# Patient Record
Sex: Male | Born: 1975 | Race: Black or African American | Hispanic: No | Marital: Married | Smoking: Never smoker
Health system: Southern US, Community
[De-identification: ages and names within clinical notes are randomized; demographics above are authoritative.]

## PROBLEM LIST (undated history)

## (undated) ENCOUNTER — Emergency Department (HOSPITAL_COMMUNITY): Admission: EM | Payer: Self-pay | Source: Home / Self Care

## (undated) DIAGNOSIS — E119 Type 2 diabetes mellitus without complications: Secondary | ICD-10-CM

## (undated) HISTORY — PX: FRACTURE SURGERY: SHX138

---

## 2006-07-26 ENCOUNTER — Emergency Department (HOSPITAL_COMMUNITY): Admission: EM | Admit: 2006-07-26 | Discharge: 2006-07-26 | Payer: Self-pay | Admitting: Family Medicine

## 2006-08-26 ENCOUNTER — Emergency Department (HOSPITAL_COMMUNITY): Admission: EM | Admit: 2006-08-26 | Discharge: 2006-08-26 | Payer: Self-pay | Admitting: Family Medicine

## 2008-06-08 ENCOUNTER — Inpatient Hospital Stay (HOSPITAL_COMMUNITY): Admission: EM | Admit: 2008-06-08 | Discharge: 2008-06-11 | Payer: Self-pay | Admitting: Emergency Medicine

## 2008-07-10 ENCOUNTER — Ambulatory Visit: Payer: Self-pay | Admitting: Family Medicine

## 2008-07-10 ENCOUNTER — Ambulatory Visit (HOSPITAL_COMMUNITY): Admission: RE | Admit: 2008-07-10 | Discharge: 2008-07-10 | Payer: Self-pay | Admitting: Family Medicine

## 2008-07-10 ENCOUNTER — Ambulatory Visit: Payer: Self-pay | Admitting: *Deleted

## 2008-07-10 LAB — CONVERTED CEMR LAB
ALT: 19 units/L (ref 0–53)
Alkaline Phosphatase: 59 units/L (ref 39–117)
BUN: 11 mg/dL (ref 6–23)
Basophils Absolute: 0 10*3/uL (ref 0.0–0.1)
Basophils Relative: 0 % (ref 0–1)
CO2: 23 meq/L (ref 19–32)
Cholesterol: 232 mg/dL — ABNORMAL HIGH (ref 0–200)
Creatinine, Ser: 0.95 mg/dL (ref 0.40–1.50)
Eosinophils Absolute: 0.1 10*3/uL (ref 0.0–0.7)
Eosinophils Relative: 2 % (ref 0–5)
Glucose, Bld: 210 mg/dL — ABNORMAL HIGH (ref 70–99)
HCT: 42.6 % (ref 39.0–52.0)
LDL Cholesterol: 172 mg/dL — ABNORMAL HIGH (ref 0–99)
MCV: 93.8 fL (ref 78.0–100.0)
Microalb, Ur: 6.69 mg/dL — ABNORMAL HIGH (ref 0.00–1.89)
RBC: 4.54 M/uL (ref 4.22–5.81)
Total CHOL/HDL Ratio: 5.7
Total Protein: 8.2 g/dL (ref 6.0–8.3)
VLDL: 19 mg/dL (ref 0–40)

## 2010-11-25 NOTE — Discharge Summary (Signed)
NAMECALAN, Philip Ingram                ACCOUNT NO.:  [id number]   MEDICAL RECORD NO.:  [id number]          PATIENT TYPE:  INP   LOCATION:  5158                         FACILITY:  [facility]   PHYSICIAN:  Isidor Holts, M.D.  DATE OF BIRTH:  Jun 29, 1976   DATE OF ADMISSION:  06/08/2008  DATE OF DISCHARGE:  06/11/2008                               DISCHARGE SUMMARY   PMD:  Unassigned.   DISCHARGE DIAGNOSES:  1. Right upper lobe community-acquired pneumonia.  2. Type 1 diabetes mellitus, new diagnosis.  3. Dehydration, secondary to #2 above.   DISCHARGE MEDICATIONS:  1. Tamiflu 75 mg p.o. b.i.d. on June 12, 2008.  2. Avelox 400 mg p.o. daily for 7 days, from June 12, 2008.  3. Lantus insulin 15 units subcutaneously q.12 hours.  4. NovoLog insulin subcutaneously per sliding scale as follows:  CBG      70-100 no insulin, CBG 101-150 2 units, CBG 151-200 3 units, CBG      201-250 5 units, CBG 251-300 7 units, CBG 301-350 9 units, CBG 351-      400 11 units.   PROCEDURES:  1. Chest x-ray dated June 08, 2008:  This showed probable      pneumonia in right upper lobe.  2. Chest x-ray dated June 09, 2008:  This showed further      consolidation right upper lobe.  3. Chest x-ray dated June 06, 2008:  This showed stable right      upper lobe pneumonia.   CONSULTATIONS:  None.   ADMISSION HISTORY:  As in H and P notes of June 08, 2008 dictated by  Dr. Lucita Ferrara.  However, in brief this is a 35 year old male, with no  significant past medical history, who presents with fever, shortness of  breath, increased urinary frequency, and thirst, associated with diffuse  myalgias, headache, and an unproductive cough.  He was on initial  evaluation, found to have leukocytosis of 20.  Chest x-ray showed right  upper lobe pneumonia.  Temperature was 102.6.  He was admitted for  further evaluation, investigation, and management.   CLINICAL COURSE:  1. Right upper lobe  community-acquired pneumonia.  For details of      presentation, refer to admission history above.  The patient was      managed with an initial combination of intravenous Vancomycin and      Zosyn. Blood cultures were sent off; however, remained negative.      Over the course of his hospitalization the patient steadily      improved.  As of June 01, 2008 he had no further recorded      episodes of pyrexia, and he experienced steady diminution in white      cell count which had dropped down from 20.0 at the time of      presentation, to 12.3 in the a.m. of June 11, 2008.  Because of      epidemiologic considerations the patient was placed on droplet      isolation during the course of this hospitalization, and treated      with a  5-day course of Tamiflu, to be completed on June 12, 2008.  As of June 06, 2008, he has improved so much, that he      was transitioned to monotherapy with Avelox for a further 7-day      course of treatment.   1. Type 1 diabetes mellitus.  This is a new diagnosis.  The patient      presented with polydipsia, polyuria, increased weakness as well as      dehydration, with a BUN of 17, creatinine 1.0, glucose 275.      Hemoglobin A1c was found to be significantly elevated at 12.1.  He      was managed with a combination of carbohydrate modified diet,      scheduled Lantus, and sliding scale insulin coverage, with      satisfactory clinical response.  By June 11, 2008 his CBGs were      ranging between 126 and 128, and he no longer had polydipsia or      polyuria.  He has undergone diabetic teaching during the course of      this hospitalization, and taught to self-administer his insulin, as      well as monitor his CBGs.  Arrangements have been put in place for      him to follow up with the outpatient diabetic clinic.   1. Dehydration.  The patient at time of presentation, appeared      clinically dehydrated with BUN 17, creatinine 1.0.   He had a      hypernatremia of 126, consistent with significant volume depletion.      He was managed with aggressive intravenous fluid hydration with      normal saline, and by June 10, 2008, hydration status had      significantly improved, the patient was euvolemic, BUN was 12, and      creatinine 0.81.  Intravenous fluids were discontinued, and on      June 11, 2008 BUN was 10, creatinine 0.72, and sodium 138.   DISPOSITION:  The patient was on June 11, 2008, considered  clinically stable for discharge.  He was therefore, discharged  accordingly.   DIET:  Carbohydrate modified.   ACTIVITY:  As tolerated.   FOLLOWUP INSTRUCTIONS:  The patient is recommended to follow up with  [facility] this week.  He has been supplied with the appropriate  information.   SPECIAL INSTRUCTIONS:  Outpatient diabetic clinic followup has been  arranged.      Isidor Holts, M.D.  Electronically Signed     CO/MEDQ  D:  06/11/2008  T:  06/11/2008  Job:  [id number]   cc:   Primary MD  Medical Department [facility]

## 2010-11-25 NOTE — H&P (Signed)
NAMEMAKANI, Philip                ACCOUNT NO.:  [id number]   MEDICAL RECORD NO.:  [id number]          PATIENT TYPE:  EMS   LOCATION:  [facility]JO                         FACILITY:  [facility]   PHYSICIAN:  Lucita Ferrara, MD         DATE OF BIRTH:  04-27-1976   DATE OF ADMISSION:  06/08/2008  DATE OF DISCHARGE:                              HISTORY & PHYSICAL   PRIMARY CARE DOCTOR:  Unassigned.   HISTORY:  The patient is a 35 year old African American male who  presents to [facility] with fevers, shortness of breath,  polyuria, polydipsia, polyphagia.  He also complains of myalgias,  diffuse headache and mild nonproductive cough.  He is weak.  Weakness is  generalized.  No focal neurological deficits.  He denies nausea,  vomiting, diarrhea.  He denies burning upon urination.  In the emergency  room, he was found to  leukocytosis of 20.  He was found to have a right  upper lobe pneumonia.  He is hemodynamically stable.   PAST MEDICAL HISTORY:  Ingram.   SOCIAL HISTORY:  The patient denies drugs, alcohol or tobacco.   ALLERGIES:  NO KNOWN DRUG ALLERGIES.   PAST SURGICAL HISTORY:  Ingram.   MEDICATIONS:  Ingram recently.   FAMILY HISTORY:  Otherwise negative.   PHYSICAL EXAMINATION:  GENERALLY SPEAKING:  The patient looks ill,  toxic.  The patient looks diaphoretic.  VITAL SIGNS:  Blood pressure is 142/94, pulse is 100-115, respiratory  rate 20, temperature T-max is 102.6, pulse ox is 99% on 2 liters nasal  cannula.  HEENT:  Mucous membranes are dry.  NECK:  Supple.  LUNGS:  Clear to auscultation bilaterally, no crackles, rales or  wheezes.  There is decreased air movement on the left side.  CARDIOVASCULAR:  S1 and S2, regular rate and rhythm.  No murmurs, rubs  or clicks.  ABDOMEN:  Soft, nontender, nondistended.  Positive bowel  sounds.  EXTREMITIES:  No clubbing, cyanosis or edema.  NEURO:  Patient is fatigued, but alert and oriented x3.  Cranial nerves  II-XII are grossly  intact.   LABORATORY DATA:  Glucose 275, hemoglobin 12.9, hematocrit 38, ionized  calcium 1.1, chloride 97, BUN 17, creatinine 1.0, white count 20,  hemoglobin 12.2, hematocrit 36.5, platelets 371.   DIAGNOSTICS:  Chest x-ray shows probable pneumonia in the right upper  lobe.   ASSESSMENT/PLAN:  A 35 year old with:  1. Leukocytosis, fever, diaphoresis and chest x-ray confirming a right      upper lobe pneumonia.  2. Hyperglycemia, rule out diabetes.  Question whether the patient has      new onset diabetes or if it is secondary to a reactive nature from      infectious etiology.   DISCUSSION/PLAN:  The patient will be admitted to the step-down unit.  We will initiate sepsis protocol,  panculture.  We will initiate IV  antibiotics broad-spectrum with vancomycin and Zosyn. Urinalysis and  urine drug screen.  Sliding-scale insulin, hemoglobin A1c, nebulizer  treatments, oxygen, ABG and hemodynamic monitoring.  I will also get a  lactic acid  level, CBC and consultation as needed if the patient's  status deteriorates.      Lucita Ferrara, MD  Electronically Signed     RR/MEDQ  D:  06/08/2008  T:  06/09/2008  Job:  [id number]

## 2011-04-14 LAB — CBC
HCT: 29.5 % — ABNORMAL LOW (ref 39.0–52.0)
HCT: 36.3 % — ABNORMAL LOW (ref 39.0–52.0)
Hemoglobin: 10.3 g/dL — ABNORMAL LOW (ref 13.0–17.0)
Hemoglobin: 10.4 g/dL — ABNORMAL LOW (ref 13.0–17.0)
Hemoglobin: 12.1 g/dL — ABNORMAL LOW (ref 13.0–17.0)
MCHC: 33.4 g/dL (ref 30.0–36.0)
MCHC: 34.4 g/dL (ref 30.0–36.0)
MCV: 90.6 fL (ref 78.0–100.0)
Platelets: 416 10*3/uL — ABNORMAL HIGH (ref 150–400)
RBC: 3.28 MIL/uL — ABNORMAL LOW (ref 4.22–5.81)
RBC: 3.35 MIL/uL — ABNORMAL LOW (ref 4.22–5.81)
RDW: 12.1 % (ref 11.5–15.5)
WBC: 12.3 10*3/uL — ABNORMAL HIGH (ref 4.0–10.5)
WBC: 14.9 10*3/uL — ABNORMAL HIGH (ref 4.0–10.5)

## 2011-04-14 LAB — GLUCOSE, CAPILLARY
Glucose-Capillary: 126 mg/dL — ABNORMAL HIGH (ref 70–99)
Glucose-Capillary: 128 mg/dL — ABNORMAL HIGH (ref 70–99)
Glucose-Capillary: 137 mg/dL — ABNORMAL HIGH (ref 70–99)
Glucose-Capillary: 145 mg/dL — ABNORMAL HIGH (ref 70–99)
Glucose-Capillary: 152 mg/dL — ABNORMAL HIGH (ref 70–99)
Glucose-Capillary: 153 mg/dL — ABNORMAL HIGH (ref 70–99)
Glucose-Capillary: 176 mg/dL — ABNORMAL HIGH (ref 70–99)
Glucose-Capillary: 188 mg/dL — ABNORMAL HIGH (ref 70–99)
Glucose-Capillary: 215 mg/dL — ABNORMAL HIGH (ref 70–99)
Glucose-Capillary: 273 mg/dL — ABNORMAL HIGH (ref 70–99)
Glucose-Capillary: 295 mg/dL — ABNORMAL HIGH (ref 70–99)

## 2011-04-14 LAB — URINALYSIS, ROUTINE W REFLEX MICROSCOPIC
Glucose, UA: >1000 mg/dL — AB
Ketones, ur: >80 mg/dL — AB
Nitrite: NEGATIVE
Protein, ur: 100 mg/dL — AB
Specific Gravity, Urine: 1.034 — ABNORMAL HIGH (ref 1.005–1.030)
pH: 5.5 (ref 5.0–8.0)

## 2011-04-14 LAB — COMPREHENSIVE METABOLIC PANEL
Albumin: 2.8 g/dL — ABNORMAL LOW (ref 3.5–5.2)
Alkaline Phosphatase: 75 U/L (ref 39–117)
BUN: 18 mg/dL (ref 6–23)
CO2: 11 mEq/L — ABNORMAL LOW (ref 19–32)
Chloride: 97 mEq/L (ref 96–112)
Chloride: 97 mEq/L (ref 96–112)
Creatinine, Ser: 1.23 mg/dL (ref 0.4–1.5)
Creatinine, Ser: 1.24 mg/dL (ref 0.4–1.5)
GFR calc Af Amer: >60 mL/min (ref >60)
GFR calc Af Amer: >60 mL/min (ref >60)
GFR calc non Af Amer: >60 mL/min (ref >60)
Potassium: 4.7 mEq/L (ref 3.5–5.1)
Sodium: 126 mEq/L — ABNORMAL LOW (ref 135–145)
Total Bilirubin: 2.3 mg/dL — ABNORMAL HIGH (ref 0.3–1.2)
Total Bilirubin: 2.6 mg/dL — ABNORMAL HIGH (ref 0.3–1.2)
Total Protein: 7.2 g/dL (ref 6.0–8.3)
Total Protein: 7.3 g/dL (ref 6.0–8.3)

## 2011-04-14 LAB — DIFFERENTIAL
Basophils Relative: 0 % (ref 0–1)
Eosinophils Absolute: 0 10*3/uL (ref 0.0–0.7)
Eosinophils Relative: 0 % (ref 0–5)
Eosinophils Relative: 0 % (ref 0–5)
Lymphocytes Relative: 9 % — ABNORMAL LOW (ref 12–46)
Lymphs Abs: 1.9 10*3/uL (ref 0.7–4.0)
Monocytes Relative: 10 % (ref 3–12)
Neutrophils Relative %: 80 % — ABNORMAL HIGH (ref 43–77)
Neutrophils Relative %: 83 % — ABNORMAL HIGH (ref 43–77)
Smear Review: ADEQUATE

## 2011-04-14 LAB — CULTURE, BLOOD (ROUTINE X 2)
Culture: NO GROWTH
Culture: NO GROWTH

## 2011-04-14 LAB — BASIC METABOLIC PANEL
BUN: 10 mg/dL (ref 6–23)
Calcium: 8 mg/dL — ABNORMAL LOW (ref 8.4–10.5)
Creatinine, Ser: 0.72 mg/dL (ref 0.4–1.5)
GFR calc Af Amer: >60 mL/min (ref >60)
GFR calc non Af Amer: >60 mL/min (ref >60)
GFR calc non Af Amer: >60 mL/min (ref >60)
Glucose, Bld: 155 mg/dL — ABNORMAL HIGH (ref 70–99)
Potassium: 3.7 mEq/L (ref 3.5–5.1)
Potassium: 3.7 mEq/L (ref 3.5–5.1)
Sodium: 132 mEq/L — ABNORMAL LOW (ref 135–145)

## 2011-04-14 LAB — URINE MICROSCOPIC-ADD ON

## 2011-04-14 LAB — LIPASE, BLOOD: Lipase: 30 U/L (ref 11–59)

## 2011-04-14 LAB — POCT I-STAT, CHEM 8
Hemoglobin: 12.9 g/dL — ABNORMAL LOW (ref 13.0–17.0)
Potassium: 4.5 mEq/L (ref 3.5–5.1)

## 2011-04-14 LAB — RAPID URINE DRUG SCREEN, HOSP PERFORMED
Amphetamines: NOT DETECTED
Benzodiazepines: NOT DETECTED

## 2011-04-14 LAB — URINE CULTURE

## 2011-04-14 LAB — HEMOGLOBIN A1C: Mean Plasma Glucose: 301 mg/dL

## 2014-03-31 ENCOUNTER — Emergency Department (HOSPITAL_COMMUNITY)
Admission: EM | Admit: 2014-03-31 | Discharge: 2014-03-31 | Disposition: A | Discharge status: Home / Self Care | Payer: Worker's Compensation | Attending: Emergency Medicine | Admitting: Emergency Medicine

## 2014-03-31 ENCOUNTER — Encounter (HOSPITAL_COMMUNITY): Payer: Self-pay | Admitting: Emergency Medicine

## 2014-03-31 ENCOUNTER — Emergency Department (HOSPITAL_COMMUNITY): Payer: Worker's Compensation

## 2014-03-31 DIAGNOSIS — S99929A Unspecified injury of unspecified foot, initial encounter: Secondary | ICD-10-CM

## 2014-03-31 DIAGNOSIS — E119 Type 2 diabetes mellitus without complications: Secondary | ICD-10-CM | POA: Insufficient documentation

## 2014-03-31 DIAGNOSIS — S8990XA Unspecified injury of unspecified lower leg, initial encounter: Secondary | ICD-10-CM | POA: Insufficient documentation

## 2014-03-31 DIAGNOSIS — S99919A Unspecified injury of unspecified ankle, initial encounter: Secondary | ICD-10-CM

## 2014-03-31 DIAGNOSIS — S82892A Other fracture of left lower leg, initial encounter for closed fracture: Secondary | ICD-10-CM

## 2014-03-31 DIAGNOSIS — Y9389 Activity, other specified: Secondary | ICD-10-CM | POA: Insufficient documentation

## 2014-03-31 DIAGNOSIS — S82853A Displaced trimalleolar fracture of unspecified lower leg, initial encounter for closed fracture: Secondary | ICD-10-CM | POA: Insufficient documentation

## 2014-03-31 DIAGNOSIS — Y9289 Other specified places as the place of occurrence of the external cause: Secondary | ICD-10-CM | POA: Insufficient documentation

## 2014-03-31 DIAGNOSIS — R296 Repeated falls: Secondary | ICD-10-CM | POA: Insufficient documentation

## 2014-03-31 MED ORDER — OXYCODONE-ACETAMINOPHEN 5-325 MG PO TABS
2.0000 | ORAL_TABLET | Freq: Once | ORAL | Status: AC
  Administered 2014-03-31: 2 via ORAL
  Filled 2014-03-31: qty 2

## 2014-03-31 MED ORDER — OXYCODONE-ACETAMINOPHEN 5-325 MG PO TABS: 1.0000 | ORAL_TABLET | Freq: Four times a day (QID) | ORAL | Status: AC | PRN

## 2014-03-31 NOTE — Discharge Instructions (Signed)
Keep weight off of left foot. Use crutches to help walk. Keep foot elevated above heart to help reduce swelling. Use ice, Percocet 1-2 pills every 4-6 hours as needed for pain. Followup with orthopedics on Monday morning.  Ankle Fracture A fracture is a break in a bone. The ankle joint is made up of three bones. These include the lower (distal)sections of your lower leg bones, called the tibia and fibula, along with a bone in your foot, called the talus. Depending on how bad the break is and if more than one ankle joint bone is broken, a cast or splint is used to protect and keep your injured bone from moving while it heals. Sometimes, surgery is required to help the fracture heal properly.  There are two general types of fractures:  Stable fracture. This includes a single fracture line through one bone, with no injury to ankle ligaments. A fracture of the talus that does not have any displacement (movement of the bone on either side of the fracture line) is also stable.  Unstable fracture. This includes more than one fracture line through one or more bones in the ankle joint. It also includes fractures that have displacement of the bone on either side of the fracture line. CAUSES  A direct blow to the ankle.   Quickly and severely twisting your ankle.  Trauma, such as a car accident or falling from a significant height. RISK FACTORS You may be at a higher risk of ankle fracture if:  You have certain medical conditions.  You are involved in high-impact sports.  You are involved in a high-impact car accident. SIGNS AND SYMPTOMS   Tender and swollen ankle.  Bruising around the injured ankle.  Pain on movement of the ankle.  Difficulty walking or putting weight on the ankle.  A cold foot below the site of the ankle injury. This can occur if the blood vessels passing through your injured ankle were also damaged.  Numbness in the foot below the site of the ankle injury. DIAGNOSIS    An ankle fracture is usually diagnosed with a physical exam and X-rays. A CT scan may also be required for complex fractures. TREATMENT  Stable fractures are treated with a cast or splint and using crutches to avoid putting weight on your injured ankle. This is followed by an ankle strengthening program. Some patients require a special type of cast, depending on other medical problems they may have. Unstable fractures require surgery to ensure the bones heal properly. Your health care provider will tell you what type of fracture you have and the best treatment for your condition. HOME CARE INSTRUCTIONS   Review correct crutch use with your health care provider and use your crutches as directed. Safe use of crutches is extremely important. Misuse of crutches can cause you to fall or cause injury to nerves in your hands or armpits.  Do not put weight or pressure on the injured ankle until directed by your health care provider.  To lessen the swelling, keep the injured leg elevated while sitting or lying down.  Apply ice to the injured area:  Put ice in a plastic bag.  Place a towel between your cast and the bag.  Leave the ice on for 20 minutes, 2-3 times a day.  If you have a plaster or fiberglass cast:  Do not try to scratch the skin under the cast with any objects. This can increase your risk of skin infection.  Check the skin around  the cast every day. You may put lotion on any red or sore areas.  Keep your cast dry and clean.  If you have a plaster splint:  Wear the splint as directed.  You may loosen the elastic around the splint if your toes become numb, tingle, or turn cold or blue.  Do not put pressure on any part of your cast or splint; it may break. Rest your cast only on a pillow the first 24 hours until it is fully hardened.  Your cast or splint can be protected during bathing with a plastic bag sealed to your skin with medical tape. Do not lower the cast or splint  into water.  Take medicines as directed by your health care provider. Only take over-the-counter or prescription medicines for pain, discomfort, or fever as directed by your health care provider.  Do not drive a vehicle until your health care provider specifically tells you it is safe to do so.  If your health care provider has given you a follow-up appointment, it is very important to keep that appointment. Not keeping the appointment could result in a chronic or permanent injury, pain, and disability. If you have any problem keeping the appointment, call the facility for assistance. SEEK MEDICAL CARE IF: You develop increased swelling or discomfort. SEEK IMMEDIATE MEDICAL CARE IF:   Your cast gets damaged or breaks.  You have continued severe pain.  You develop new pain or swelling after the cast was put on.  Your skin or toenails below the injury turn blue or gray.  Your skin or toenails below the injury feel cold, numb, or have loss of sensitivity to touch.  There is a bad smell or pus draining from under the cast. MAKE SURE YOU:   Understand these instructions.  Will watch your condition.  Will get help right away if you are not doing well or get worse. Document Released: 06/26/2000 Document Revised: 07/04/2013 Document Reviewed: 01/26/2013 [facility] Patient Information 2015 [place], [place]. This information is not intended to replace advice given to you by your health care provider. Make sure you discuss any questions you have with your health care provider.   Emergency Department Resource Guide 1) Find a Doctor and Pay Out of Pocket Although you won't have to find out who is covered by your insurance plan, it is a good idea to ask around and get recommendations. You will then need to call the office and see if the doctor you have chosen will accept you as a new patient and what types of options they offer for patients who are self-pay. Some doctors offer discounts or will set  up payment plans for their patients who do not have insurance, but you will need to ask so you aren't surprised when you get to your appointment.  2) Contact Your Local Health Department Not all health departments have doctors that can see patients for sick visits, but many do, so it is worth a call to see if yours does. If you don't know where your local health department is, you can check in your phone book. The CDC also has a tool to help you locate your state's health department, and many state websites also have listings of all of their local health departments.  3) Find a Walk-in Clinic If your illness is not likely to be very severe or complicated, you may want to try a walk in clinic. These are popping up all over the country in pharmacies, drugstores, and shopping centers.  They're usually staffed by nurse practitioners or physician assistants that have been trained to treat common illnesses and complaints. They're usually fairly quick and inexpensive. However, if you have serious medical issues or chronic medical problems, these are probably not your best option.  No Primary Care Doctor: - Call Health Connect at  [phone] - they can help you locate a primary care doctor that  accepts your insurance, provides certain services, etc. - Physician Referral Service- [phone]  Chronic Pain Problems: Organization         Address  Phone   Notes  [facility]  [phone] Patients need to be referred by their primary care doctor.   Medication Assistance: Organization         Address  Phone   Notes  [facility] Medication [facility] [address] [place] [place], [place] [address] [phone] --Must be a resident of [facility] -- Must have NO insurance coverage whatsoever (no Medicaid/ Medicare, etc.) -- The pt. MUST have a primary care doctor that directs their care regularly and follows them in the community   [facility]  [phone]    [employer]  [phone]    Agencies that provide inexpensive medical care: Organization         Address  Phone   Notes  [facility]  [phone]   [facility]    [phone]   [facility] [address] [place], [place] [address] [phone]   [facility] [address] [place] [place] [phone]   Planned Parenthood    [phone]   [facility]    [phone]   [facility]  [address], [place] Phone:  [phone], Fax:  [phone] Hours of Operation:  9 am - 6 pm, M-F.  Also accepts Medicaid/Medicare and self-pay.  [facility]  [address], [place] Phone: [phone], Fax: [phone]. Hours of Operation:  8:30 am - 5:30 pm, M-F.  Also accepts Medicaid and self-pay.  [facility] [place] [address] [place] Phone: [phone]   [facility] [address] Natasha Bence [place], [place] [phone], Ext. [phone] Mondays & Thursdays: 7-9 AM.  First 15 patients are seen on a first come, first serve basis.    Medicaid-accepting [facility] Providers:  Organization         Address  Phone   Notes  [facility] [address], [place] [phone] Also accepts self-pay patients.  [facility] [address] Laurell Josephs [place], [place]  [phone]   [facility] [address], [place] [phone]   [facility] [address] [place] [phone]   Renaye Rakers [address], [place]   [phone] Only accepts [place] patients after they have their name applied to their card.   Self-Pay (no insurance) in [facility]:  Organization         Address  Phone   Notes  Sickle Cell Patients, [facility] Internal Medicine [address] [place],  [place] [phone]   [facility] [address] [place], [place] [phone]   [facility] [place]  [address],  [phone]  [facility]/Dr. Osei-Bonsu  [address], [place] or [address], [place] [phone] Phone number for both [place] and [place] locations is the same.  [facility] [address], [place] [phone]   [facility] [address] [place] or [address] [phone]   [facility] [address] [place] [phone], phone; [phone], fax Sees patients 1st and 3rd Saturday of every month.  Must not qualify for public or private insurance (i.e. Medicaid, Medicare, [place] Health Choice, Veterans' Benefits)  Household income should be no more than 200% of the poverty level The clinic cannot treat you if you are pregnant or think you are pregnant  Sexually transmitted diseases are not treated at the clinic.    Dental Care: Organization         Address  Phone  Notes  [facility] Department of [facility] [address] [place], [place] [phone] Accepts children up to age 50 who are enrolled in [place] or [place] Health Choice; pregnant women with a Medicaid card; and children who have applied for Medicaid or [place] Health Choice, but were declined, whose parents can pay a reduced fee at time of service.  [facility] Department of [facility]  [address], [place] [phone] Accepts children up to age 56 who are enrolled in [place] or [place] Health Choice; pregnant women with a Medicaid card; and children who have applied for Medicaid or  Health Choice, but were declined, whose parents can pay a reduced fee at time of service.  [facility]  [address] [place], [place] [phone] Patients are seen by appointment only. Walk-ins are not accepted. [facility] will see patients 45 years of age and older. Monday - Tuesday (8am-5pm) Most Wednesdays (8:30-5pm) $30 per visit, cash only  [facility]  [address], [facility] [phone] Patients are seen by appointment only. Walk-ins are not accepted. [facility] will see patients 43 years of age and older. One Wednesday Evening (Monthly: Volunteer Based).  $30 per visit, cash only  [employer]  [phone] for adults; Children under age 56, call Graduate Pediatric Dentistry at [phone]. Children aged 35-14, please call [phone] to request a pediatric application.  Dental services are provided in all areas of dental care including fillings, crowns and bridges, complete and partial dentures, implants, gum treatment, root canals, and extractions. Preventive care is also provided. Treatment is provided to both adults and children. Patients are selected via a lottery and there is often a waiting list.   [facility] [address] [place]  [phone] [email or web address]   [facility] [address] [place], [place] [phone], Ext. [phone] Second and Fourth Thursday of each month, opens at 6:30 AM; Clinic ends at 9 AM.  Patients are seen on a first-come first-served basis, and a limited number are seen during each clinic.   [facility]  [address] Ether Griffins [place], [place] [phone]   Eligibility Requirements You must have lived in [place], [place], or [place] counties for at least the last three months.   You cannot be eligible for state or federal sponsored [place], including [employer], [place], or [employer].   You generally cannot be eligible for healthcare insurance through  your employer.    How to apply: Eligibility screenings are held every Tuesday and Wednesday afternoon  from 1:00 pm until 4:00 pm. You do not need an appointment for the interview!  [facility] [address], [place], [place] [phone]   [facility] Health Department  [phone]   [facility] Health Department  [phone]   [facility] Health Department  [phone]    Behavioral Health Resources in the Community: Intensive Outpatient Programs Organization         Address  Phone  Notes  [facility] [address], [place], [place] [phone]   [facility] [address] [place], [place] [phone]   [facility] [address] [place], [place]  [phone]   [facility] [address]  [place], [place] [phone] or [phone]   Substance Abuse Resources Organization         Address  Phone  Notes  [facility]  [phone]   [facility]  [phone]   [facility] [place]  [phone]   Floydene Flock  [phone]   Residential & Outpatient Substance Abuse Program  [phone]   Psychological Services Organization         Address  Phone  Notes  [facility]  [phone]   [facility] Services  [phone]   [facility] Mental Health [address] [place] [phone] or [phone]    Mobile Crisis Teams Organization         Address  Phone  Notes  [facility], Mobile Crisis Care Unit  [phone]   [facility]  [address] [place], [place] [phone]   Doristine Locks [address] [place] [phone]    Self-Help/Support Groups Organization         Address  Phone             Notes  [facility] [place] - variety of support groups  [phone]- I7437963 Call for more information  Narcotics Anonymous (NA), [facility] [address], [employer] [place]  2 meetings at this location   Nutritional therapist         Address  Phone  Notes  [facility] [address],    [place]  [phone]   [facility]  [address] [place] [address], [place], [place] [phone]   [facility] [address] [place], [place] [phone] Admissions: 8am-3pm M-F  [facility] [address]    [place], [place] [phone]   [facility] [address] [place], [place], [place] [phone]   [facility] [address],  [place], [place] [phone]   [facility] - Intensive Outpatient [address] Laurell Josephs [address], [place], [place] [phone]   [facility] ([facility] [address]  [place], [place] [phone] or [phone]   [facility] ([facility]) [address], [place], [place] [phone] Accepts Medicaid  [facility] [address],  [place] [phone] Substance Abuse/Addiction Treatment   [facility] Organization         Address  Phone  Notes  [facility]  [phone]   Angie Fava, PhD [address] [place], [place]   [phone] or [phone]   [facility] Behavioral   [address] [place], [place] [phone]  [facility] [address] [place], [place] [phone] Insurance/Medicaid/sponsorship through [employer] and Families [address] [place], [place] [phone] [place], [place] [phone]    Dr. Adele Schilder  [phone]   [facility] Dept. 1) [address], [place] 2) [place] 3)  [place] [address], [place] [phone]  [phone]   [place] [phone] or [phone] (After Hours)

## 2014-03-31 NOTE — ED Notes (Signed)
Pt slipped on a curb injuring left ankle- swelling and redness noted to ankle- pedal pulse intact.

## 2014-03-31 NOTE — ED Provider Notes (Signed)
CSN: [id number]     Arrival date & time 03/31/14  1637 History  This chart was scribed for non-physician practitioner working with No att. providers found by Elveria Rising, ED Scribe. This patient was seen in room TR09C/TR09C and the patient's care was started at 5:38 PM.   Chief Complaint  Patient presents with  . Ankle Pain   The history is provided by the patient. No language interpreter was used.   HPI Comments: Philip Ingram is a 38 y.o. male who presents to the Emergency Department with a left ankle injury incurred today. 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 Patient presents  With severe swelling to his left medial and lateral malleolus. Patient reports everting his ankle during the fall and reports obvious deformity after the fall. Patient thought he had "dislocated his ankle" but it appears that he reduced the dislocation with standing. Patient reports that he was ambulatory immediately after the incident with pain and he was able to bear light weight. Since the fall patient reports increased swelling and pain.  Past Medical History  Diagnosis Date  . Diabetes mellitus without complication    History reviewed. No pertinent past surgical history. No family history on file. History  Substance Use Topics  . Smoking status: Never Smoker   . Smokeless tobacco: Not on file  . Alcohol Use: No    Review of Systems  Constitutional: Negative for fever and chills.  Gastrointestinal: Negative for nausea, vomiting and diarrhea.  Musculoskeletal: Positive for arthralgias. Negative for back pain and gait problem.  Neurological: Negative for weakness and numbness.   Allergies  Review of patient's allergies indicates no known allergies.  Home Medications   Prior to Admission medications   Medication Sig Start Date End Date Taking? Authorizing Provider  oxyCODONE-acetaminophen (PERCOCET) 5-325 MG per  tablet Take 1-2 tablets by mouth every 6 (six) hours as needed. 03/31/14   Monte Fantasia, PA-C   Triage Vitals: BP 120/84  Pulse 95  Temp(Src) 98.4 F (36.9 C) (Oral)  Resp 18  Ht  (1.727 m)  Wt 200 lb (90.719 kg)  BMI 30.42 kg/m2  SpO2 99%  Physical Exam  Nursing note and vitals reviewed. Constitutional: He is oriented to person, place, and time. He appears well-developed and well-nourished. No distress.  HENT:  Head: Normocephalic and atraumatic.  Eyes: EOM are normal.  Neck: Neck supple.  Cardiovascular: Normal rate.   Pulmonary/Chest: Effort normal. No respiratory distress.  Musculoskeletal: Normal range of motion. He exhibits tenderness.  Moderate swelling to lateral and medial malleolus. Good plantar and dorsal flexion. Pain with nversion and eversion to his foot. DP 2+. PT unable to tell due to swelling. Distal sensation intact. Capillary refill <2 seconds.   Neurological: He is alert and oriented to person, place, and time.  Skin: Skin is warm and dry.  Psychiatric: He has a normal mood and affect. His behavior is normal.    ED Course  Procedures (including critical care time)  COORDINATION OF CARE: 5:38 PM- Plans to X-ray. Patient advised to follow up with ortho. Discussed treatment plan with patient at bedside and patient agreed to plan.   Labs Review Labs Reviewed - No data to display  Imaging Review Dg Ankle Complete Left  03/31/2014   CLINICAL DATA:  Inversion injury of the left ankle now with diffuse swelling and pain  EXAM: LEFT ANKLE COMPLETE - 3+ VIEW  COMPARISON:  None.  FINDINGS: The patient has sustained a trimalleolar fracture of the left ankle. The spiral fracture of the distal fibula is mildly distracted and angulated. The medial malleolar fracture is transversely oriented and minimally distracted. The small posterior malleolar fracture is mildly distracted. The ankle joint mortise is reasonably well maintained. The talus and calcaneus are intact.  The metatarsal bases are intact. There is diffuse soft tissue swelling.  IMPRESSION: There is a mildly displaced trimalleolar fracture of the left ankle.   Electronically Signed   By: David  [place]   On: 03/31/2014 18:10     EKG Interpretation None      MDM   Final diagnoses:  Malleolar fracture, left, closed, initial encounter   Patient here with left ankle pain x-ray showing a trimalleolar fracture. We will treat pain, call orthopedics  7:00 PM: I spoke with Dr. Lajoyce Corners with orthopedic surgery who recommends placing patient in a Cam Boot walker, using crutches, and keeping his foot elevated above his heart weekend. Nonweightbearing, and to call orthopedics Monday morning after 7:15 AM.  Patient placed in boot, given crutches, and pain medicine to go home with. Patient agreed to the above plan. Patient stating he is not currently taking anything for diabetes, not has not had any followup for in a few years. Patient states he was formally on metformin, and after losing weight, he stopped taking metformin, and has not had any followup since. I encouraged patient also call his primary care physician on Monday, to help insure his diabetes is under control if he is to have surgery. Patient is agreeable to this plan. I also encouraged patient to use RICE protocol and return to the ER if his symptoms worsen, change or should he have any questions or concerns.  Signed,  Ladona Mow, PA-C 12:17 AM   This patient discussed with Dr. Blake Divine, MD  I personally performed the services described in this documentation, which was scribed in my presence. The recorded information has been reviewed and is accurate.    Monte Fantasia, PA-C 04/01/14 [phone]

## 2014-04-01 NOTE — ED Provider Notes (Signed)
Medical screening examination/treatment/procedure(s) were performed by non-physician practitioner and as supervising physician I was immediately available for consultation/collaboration.   EKG Interpretation None        Candyce Churn III, MD 04/01/14 [phone]

## 2014-04-02 ENCOUNTER — Telehealth: Payer: Self-pay | Admitting: *Deleted

## 2014-04-02 NOTE — Telephone Encounter (Signed)
Philip Ingram from [facility] office called to request NPI number for patient.  Pt has appt for left ankle fracture.  NPI given x 1 visit, pt needs to contact medicaid office to change provider information on card. Clovis Pu, RN

## 2014-04-05 ENCOUNTER — Other Ambulatory Visit (HOSPITAL_COMMUNITY): Payer: Self-pay | Admitting: Orthopedic Surgery

## 2014-04-05 ENCOUNTER — Encounter (HOSPITAL_COMMUNITY): Payer: Self-pay | Admitting: Pharmacy Technician

## 2014-04-05 NOTE — Progress Notes (Signed)
Several unsuccessful attempts were made to contact pt; lvm at # [phone] with pre-op instructions.

## 2014-04-06 ENCOUNTER — Encounter (HOSPITAL_COMMUNITY): Payer: Self-pay | Admitting: *Deleted

## 2014-04-06 ENCOUNTER — Ambulatory Visit (HOSPITAL_COMMUNITY): Payer: Worker's Compensation

## 2014-04-06 ENCOUNTER — Ambulatory Visit (HOSPITAL_COMMUNITY): Payer: Worker's Compensation | Admitting: Anesthesiology

## 2014-04-06 ENCOUNTER — Ambulatory Visit (HOSPITAL_COMMUNITY)
Admission: RE | Admit: 2014-04-06 | Discharge: 2014-04-07 | Disposition: A | Discharge status: Home / Self Care | Payer: Worker's Compensation | Source: Ambulatory Visit | Attending: Orthopedic Surgery | Admitting: Orthopedic Surgery

## 2014-04-06 ENCOUNTER — Encounter (HOSPITAL_COMMUNITY)
Admission: RE | Disposition: A | Discharge status: Home / Self Care | Payer: Self-pay | Source: Ambulatory Visit | Attending: Orthopedic Surgery

## 2014-04-06 ENCOUNTER — Encounter (HOSPITAL_COMMUNITY): Payer: Worker's Compensation | Admitting: Anesthesiology

## 2014-04-06 DIAGNOSIS — S82842A Displaced bimalleolar fracture of left lower leg, initial encounter for closed fracture: Secondary | ICD-10-CM

## 2014-04-06 DIAGNOSIS — S82843A Displaced bimalleolar fracture of unspecified lower leg, initial encounter for closed fracture: Secondary | ICD-10-CM | POA: Diagnosis present

## 2014-04-06 DIAGNOSIS — X58XXXA Exposure to other specified factors, initial encounter: Secondary | ICD-10-CM | POA: Insufficient documentation

## 2014-04-06 DIAGNOSIS — E119 Type 2 diabetes mellitus without complications: Secondary | ICD-10-CM | POA: Insufficient documentation

## 2014-04-06 HISTORY — PX: ORIF ANKLE FRACTURE BIMALLEOLAR: SUR920

## 2014-04-06 HISTORY — PX: ORIF ANKLE FRACTURE: SHX5408

## 2014-04-06 HISTORY — DX: Type 2 diabetes mellitus without complications: E11.9

## 2014-04-06 LAB — COMPREHENSIVE METABOLIC PANEL
ALK PHOS: 74 U/L (ref 39–117)
ALT: 14 U/L (ref 0–53)
AST: 11 U/L (ref 0–37)
Albumin: 3.8 g/dL (ref 3.5–5.2)
Anion gap: 17 — ABNORMAL HIGH (ref 5–15)
BUN: 12 mg/dL (ref 6–23)
CO2: 24 meq/L (ref 19–32)
Calcium: 9.5 mg/dL (ref 8.4–10.5)
Chloride: 99 mEq/L (ref 96–112)
Creatinine, Ser: 0.76 mg/dL (ref 0.50–1.35)
GFR calc non Af Amer: >90 mL/min (ref >90)
GLUCOSE: 320 mg/dL — AB (ref 70–99)
POTASSIUM: 4.3 meq/L (ref 3.7–5.3)
SODIUM: 140 meq/L (ref 137–147)
TOTAL PROTEIN: 7.9 g/dL (ref 6.0–8.3)
Total Bilirubin: 1 mg/dL (ref 0.3–1.2)

## 2014-04-06 LAB — GLUCOSE, CAPILLARY
GLUCOSE-CAPILLARY: 172 mg/dL — AB (ref 70–99)
Glucose-Capillary: 324 mg/dL — ABNORMAL HIGH (ref 70–99)
Glucose-Capillary: 274 mg/dL — ABNORMAL HIGH (ref 70–99)
Glucose-Capillary: 243 mg/dL — ABNORMAL HIGH (ref 70–99)
Glucose-Capillary: 243 mg/dL — ABNORMAL HIGH (ref 70–99)
Glucose-Capillary: 205 mg/dL — ABNORMAL HIGH (ref 70–99)

## 2014-04-06 LAB — CBC
HCT: 37.8 % — ABNORMAL LOW (ref 39.0–52.0)
HEMOGLOBIN: 13.3 g/dL (ref 13.0–17.0)
MCH: 30.8 pg (ref 26.0–34.0)
MCHC: 35.2 g/dL (ref 30.0–36.0)
MCV: 87.5 fL (ref 78.0–100.0)
Platelets: 374 10*3/uL (ref 150–400)
RBC: 4.32 MIL/uL (ref 4.22–5.81)
RDW: 12.1 % (ref 11.5–15.5)
WBC: 7.8 10*3/uL (ref 4.0–10.5)

## 2014-04-06 LAB — PROTIME-INR
INR: 1.02 (ref 0.00–1.49)
Prothrombin Time: 13.4 seconds (ref 11.6–15.2)

## 2014-04-06 LAB — APTT: APTT: 25 s (ref 24–37)

## 2014-04-06 SURGERY — OPEN REDUCTION INTERNAL FIXATION (ORIF) ANKLE FRACTURE
Anesthesia: General | Site: Ankle | Laterality: Left

## 2014-04-06 MED ORDER — PROPOFOL 10 MG/ML IV BOLUS
INTRAVENOUS | Status: AC
  Filled 2014-04-06: qty 20

## 2014-04-06 MED ORDER — CEFAZOLIN SODIUM-DEXTROSE 2-3 GM-% IV SOLR
2.0000 g | INTRAVENOUS | Status: AC
  Administered 2014-04-06: 2 g via INTRAVENOUS

## 2014-04-06 MED ORDER — OXYCODONE-ACETAMINOPHEN 5-325 MG PO TABS: 1.0000 | ORAL_TABLET | ORAL | Status: AC | PRN

## 2014-04-06 MED ORDER — METHOCARBAMOL 1000 MG/10ML IJ SOLN
500.0000 mg | Freq: Four times a day (QID) | INTRAVENOUS | Status: DC | PRN
  Filled 2014-04-06: qty 5

## 2014-04-06 MED ORDER — FENTANYL CITRATE 0.05 MG/ML IJ SOLN
INTRAMUSCULAR | Status: DC | PRN
  Administered 2014-04-06: 150 ug via INTRAVENOUS
  Administered 2014-04-06: 100 ug via INTRAVENOUS

## 2014-04-06 MED ORDER — MIDAZOLAM HCL 2 MG/2ML IJ SOLN
INTRAMUSCULAR | Status: DC | PRN
  Administered 2014-04-06: 2 mg via INTRAVENOUS

## 2014-04-06 MED ORDER — INSULIN ASPART 100 UNIT/ML ~~LOC~~ SOLN
10.0000 [IU] | Freq: Once | SUBCUTANEOUS | Status: AC
  Administered 2014-04-06: 10 [IU] via SUBCUTANEOUS

## 2014-04-06 MED ORDER — MIDAZOLAM HCL 2 MG/2ML IJ SOLN
INTRAMUSCULAR | Status: AC
  Filled 2014-04-06: qty 2

## 2014-04-06 MED ORDER — ONDANSETRON HCL 4 MG/2ML IJ SOLN
INTRAMUSCULAR | Status: DC | PRN
  Administered 2014-04-06: 4 mg via INTRAVENOUS

## 2014-04-06 MED ORDER — ONDANSETRON HCL 4 MG/2ML IJ SOLN
4.0000 mg | Freq: Four times a day (QID) | INTRAMUSCULAR | Status: DC | PRN
Start: 2014-04-06 — End: 2014-04-07

## 2014-04-06 MED ORDER — ONDANSETRON HCL 4 MG PO TABS: 4.0000 mg | ORAL_TABLET | Freq: Four times a day (QID) | ORAL | Status: DC | PRN

## 2014-04-06 MED ORDER — PROPOFOL 10 MG/ML IV BOLUS
INTRAVENOUS | Status: DC | PRN
  Administered 2014-04-06: 200 mg via INTRAVENOUS

## 2014-04-06 MED ORDER — ONDANSETRON HCL 4 MG/2ML IJ SOLN: 4.0000 mg | Freq: Once | INTRAMUSCULAR | Status: DC | PRN

## 2014-04-06 MED ORDER — HYDROMORPHONE HCL 1 MG/ML IJ SOLN: 0.5000 mg | INTRAMUSCULAR | Status: DC | PRN

## 2014-04-06 MED ORDER — ONDANSETRON HCL 4 MG/2ML IJ SOLN
INTRAMUSCULAR | Status: AC
  Filled 2014-04-06: qty 2

## 2014-04-06 MED ORDER — METOCLOPRAMIDE HCL 5 MG/ML IJ SOLN: 5.0000 mg | Freq: Three times a day (TID) | INTRAMUSCULAR | Status: DC | PRN

## 2014-04-06 MED ORDER — CEFAZOLIN SODIUM 1-5 GM-% IV SOLN
1.0000 g | Freq: Four times a day (QID) | INTRAVENOUS | Status: AC
  Administered 2014-04-06 – 2014-04-07 (×3): 1 g via INTRAVENOUS
  Filled 2014-04-06 (×3): qty 50

## 2014-04-06 MED ORDER — INSULIN ASPART 100 UNIT/ML ~~LOC~~ SOLN
5.0000 [IU] | Freq: Once | SUBCUTANEOUS | Status: AC
  Administered 2014-04-06: 5 [IU] via INTRAVENOUS
  Filled 2014-04-06: qty 1
  Filled 2014-04-06: qty 0.05

## 2014-04-06 MED ORDER — OXYCODONE-ACETAMINOPHEN 5-325 MG PO TABS
1.0000 | ORAL_TABLET | ORAL | Status: DC | PRN
  Administered 2014-04-06 – 2014-04-07 (×6): 2 via ORAL
  Filled 2014-04-06 (×8): qty 2

## 2014-04-06 MED ORDER — NEOSTIGMINE METHYLSULFATE 10 MG/10ML IV SOLN
INTRAVENOUS | Status: DC | PRN
  Administered 2014-04-06: 2 mg via INTRAVENOUS

## 2014-04-06 MED ORDER — LACTATED RINGERS IV SOLN
INTRAVENOUS | Status: DC
Start: 2014-04-06 — End: 2014-04-06
  Administered 2014-04-06 (×3): via INTRAVENOUS

## 2014-04-06 MED ORDER — DOCUSATE SODIUM 100 MG PO CAPS
100.0000 mg | ORAL_CAPSULE | Freq: Two times a day (BID) | ORAL | Status: DC
  Administered 2014-04-06 – 2014-04-07 (×2): 100 mg via ORAL
  Filled 2014-04-06 (×2): qty 1

## 2014-04-06 MED ORDER — INSULIN ASPART 100 UNIT/ML ~~LOC~~ SOLN
0.0000 [IU] | Freq: Every day | SUBCUTANEOUS | Status: DC
Start: 2014-04-06 — End: 2014-04-07

## 2014-04-06 MED ORDER — INSULIN ASPART 100 UNIT/ML ~~LOC~~ SOLN
SUBCUTANEOUS | Status: AC
  Filled 2014-04-06: qty 10

## 2014-04-06 MED ORDER — METHOCARBAMOL 500 MG PO TABS
500.0000 mg | ORAL_TABLET | Freq: Four times a day (QID) | ORAL | Status: DC | PRN
  Administered 2014-04-06 – 2014-04-07 (×2): 500 mg via ORAL
  Filled 2014-04-06 (×3): qty 1

## 2014-04-06 MED ORDER — LIDOCAINE HCL (CARDIAC) 20 MG/ML IV SOLN
INTRAVENOUS | Status: AC
  Filled 2014-04-06: qty 5

## 2014-04-06 MED ORDER — LIDOCAINE HCL (CARDIAC) 20 MG/ML IV SOLN
INTRAVENOUS | Status: DC | PRN
  Administered 2014-04-06: 80 mg via INTRAVENOUS

## 2014-04-06 MED ORDER — CEFAZOLIN SODIUM-DEXTROSE 2-3 GM-% IV SOLR
INTRAVENOUS | Status: AC
  Filled 2014-04-06: qty 50

## 2014-04-06 MED ORDER — ASPIRIN EC 325 MG PO TBEC
325.0000 mg | DELAYED_RELEASE_TABLET | Freq: Every day | ORAL | Status: DC
  Administered 2014-04-06 – 2014-04-07 (×2): 325 mg via ORAL
  Filled 2014-04-06 (×2): qty 1

## 2014-04-06 MED ORDER — SODIUM CHLORIDE 0.9 % IV SOLN
INTRAVENOUS | Status: DC
  Administered 2014-04-06: 19:00:00 via INTRAVENOUS

## 2014-04-06 MED ORDER — HYDROMORPHONE HCL 1 MG/ML IJ SOLN
INTRAMUSCULAR | Status: DC | PRN
  Administered 2014-04-06: 1 mg via INTRAVENOUS

## 2014-04-06 MED ORDER — GLYCOPYRROLATE 0.2 MG/ML IJ SOLN
INTRAMUSCULAR | Status: AC
  Filled 2014-04-06: qty 4

## 2014-04-06 MED ORDER — HYDROMORPHONE HCL 1 MG/ML IJ SOLN
INTRAMUSCULAR | Status: AC
  Filled 2014-04-06: qty 1

## 2014-04-06 MED ORDER — ROCURONIUM BROMIDE 50 MG/5ML IV SOLN
INTRAVENOUS | Status: AC
  Filled 2014-04-06: qty 1

## 2014-04-06 MED ORDER — INSULIN ASPART 100 UNIT/ML ~~LOC~~ SOLN
5.0000 [IU] | Freq: Once | SUBCUTANEOUS | Status: AC
  Administered 2014-04-06: 5 [IU] via INTRAVENOUS
  Filled 2014-04-06: qty 0.05
  Filled 2014-04-06: qty 1

## 2014-04-06 MED ORDER — INSULIN ASPART 100 UNIT/ML ~~LOC~~ SOLN
0.0000 [IU] | Freq: Three times a day (TID) | SUBCUTANEOUS | Status: DC
Start: 2014-04-07 — End: 2014-04-07
  Administered 2014-04-07: 3 [IU] via SUBCUTANEOUS
  Administered 2014-04-07: 8 [IU] via SUBCUTANEOUS
  Administered 2014-04-07: 5 [IU] via SUBCUTANEOUS

## 2014-04-06 MED ORDER — 0.9 % SODIUM CHLORIDE (POUR BTL) OPTIME
TOPICAL | Status: DC | PRN
  Administered 2014-04-06: 1000 mL

## 2014-04-06 MED ORDER — NEOSTIGMINE METHYLSULFATE 10 MG/10ML IV SOLN
INTRAVENOUS | Status: AC
  Filled 2014-04-06: qty 1

## 2014-04-06 MED ORDER — ROCURONIUM BROMIDE 100 MG/10ML IV SOLN
INTRAVENOUS | Status: DC | PRN
  Administered 2014-04-06: 20 mg via INTRAVENOUS

## 2014-04-06 MED ORDER — METOCLOPRAMIDE HCL 10 MG PO TABS: 5.0000 mg | ORAL_TABLET | Freq: Three times a day (TID) | ORAL | Status: DC | PRN

## 2014-04-06 MED ORDER — GLYCOPYRROLATE 0.2 MG/ML IJ SOLN
INTRAMUSCULAR | Status: DC | PRN
  Administered 2014-04-06: 0.4 mg via INTRAVENOUS

## 2014-04-06 MED ORDER — HYDROMORPHONE HCL 1 MG/ML IJ SOLN
0.2500 mg | INTRAMUSCULAR | Status: DC | PRN
  Administered 2014-04-06 (×2): 0.5 mg via INTRAVENOUS

## 2014-04-06 MED ORDER — FENTANYL CITRATE 0.05 MG/ML IJ SOLN
INTRAMUSCULAR | Status: AC
  Filled 2014-04-06: qty 5

## 2014-04-06 SURGICAL SUPPLY — 52 items
BANDAGE ESMARK 6X9 LF (GAUZE/BANDAGES/DRESSINGS) IMPLANT
BIT DRILL 2.5X110 QC LCP DISP (BIT) ×2 IMPLANT
BNDG CMPR 9X6 STRL LF SNTH (GAUZE/BANDAGES/DRESSINGS)
BNDG COHESIVE 4X5 TAN STRL (GAUZE/BANDAGES/DRESSINGS) ×3 IMPLANT
BNDG ESMARK 6X9 LF (GAUZE/BANDAGES/DRESSINGS)
BNDG GAUZE ELAST 4 BULKY (GAUZE/BANDAGES/DRESSINGS) ×2 IMPLANT
BNDG GAUZE STRTCH 6 (GAUZE/BANDAGES/DRESSINGS) ×3 IMPLANT
COVER SURGICAL LIGHT HANDLE (MISCELLANEOUS) ×3 IMPLANT
CUFF TOURNIQUET SINGLE 34IN LL (TOURNIQUET CUFF) IMPLANT
CUFF TOURNIQUET SINGLE 44IN (TOURNIQUET CUFF) IMPLANT
DRAPE C-ARM MINI 42X72 WSTRAPS (DRAPES) ×2 IMPLANT
DRAPE INCISE IOBAN 66X45 STRL (DRAPES) ×3 IMPLANT
DRAPE PROXIMA HALF (DRAPES) ×3 IMPLANT
DRAPE U-SHAPE 47X51 STRL (DRAPES) ×3 IMPLANT
DRSG ADAPTIC 3X8 NADH LF (GAUZE/BANDAGES/DRESSINGS) ×3 IMPLANT
DRSG PAD ABDOMINAL 8X10 ST (GAUZE/BANDAGES/DRESSINGS) ×2 IMPLANT
DURAPREP 26ML APPLICATOR (WOUND CARE) ×3 IMPLANT
ELECT REM PT RETURN 9FT ADLT (ELECTROSURGICAL) ×3
ELECTRODE REM PT RTRN 9FT ADLT (ELECTROSURGICAL) ×1 IMPLANT
GAUZE SPONGE 4X4 12PLY STRL (GAUZE/BANDAGES/DRESSINGS) ×3 IMPLANT
GLOVE BIOGEL PI IND STRL 9 (GLOVE) ×1 IMPLANT
GLOVE BIOGEL PI INDICATOR 9 (GLOVE) ×2
GLOVE SURG ORTHO 9.0 STRL STRW (GLOVE) ×3 IMPLANT
GOWN STRL REUS W/ TWL XL LVL3 (GOWN DISPOSABLE) ×3 IMPLANT
GOWN STRL REUS W/TWL XL LVL3 (GOWN DISPOSABLE) ×6
GUIDEWARE NON THREAD 1.25X150 (WIRE) ×6
GUIDEWIRE NON THREAD 1.25X150 (WIRE) IMPLANT
KIT BASIN OR (CUSTOM PROCEDURE TRAY) ×3 IMPLANT
KIT ROOM TURNOVER OR (KITS) ×3 IMPLANT
NS IRRIG 1000ML POUR BTL (IV SOLUTION) ×3 IMPLANT
PACK ORTHO EXTREMITY (CUSTOM PROCEDURE TRAY) ×3 IMPLANT
PAD ARMBOARD 7.5X6 YLW CONV (MISCELLANEOUS) ×6 IMPLANT
PADDING CAST COTTON 6X4 STRL (CAST SUPPLIES) ×3 IMPLANT
PLATE LCP 3.5 1/3 TUB 7HX81 (Plate) ×2 IMPLANT
SCREW CORTEX 3.5 12MM (Screw) ×4 IMPLANT
SCREW CORTEX 3.5 14MM (Screw) ×2 IMPLANT
SCREW CORTEX 3.5 26MM (Screw) ×2 IMPLANT
SCREW LOCK CORT ST 3.5X12 (Screw) IMPLANT
SCREW LOCK CORT ST 3.5X14 (Screw) IMPLANT
SCREW LOCK CORT ST 3.5X26 (Screw) IMPLANT
SCREW LOCK T15 FT 14X3.5X2.9X (Screw) IMPLANT
SCREW LOCKING 3.5X14 (Screw) ×3 IMPLANT
SCREW SHORT THREAD 4.0X40 (Screw) ×4 IMPLANT
SPONGE LAP 18X18 X RAY DECT (DISPOSABLE) ×3 IMPLANT
STAPLER VISISTAT 35W (STAPLE) ×2 IMPLANT
SUCTION FRAZIER TIP 10 FR DISP (SUCTIONS) ×3 IMPLANT
SUT ETHILON 2 0 PSLX (SUTURE) IMPLANT
SUT VIC AB 2-0 CTB1 (SUTURE) ×6 IMPLANT
TOWEL OR 17X24 6PK STRL BLUE (TOWEL DISPOSABLE) ×3 IMPLANT
TOWEL OR 17X26 10 PK STRL BLUE (TOWEL DISPOSABLE) ×3 IMPLANT
TUBE CONNECTING 12'X1/4 (SUCTIONS) ×1
TUBE CONNECTING 12X1/4 (SUCTIONS) ×2 IMPLANT

## 2014-04-06 NOTE — Anesthesia Procedure Notes (Signed)
Date/Time: 04/06/2014 12:58 PM Performed by: Isidore Moos A Pre-anesthesia Checklist: Patient identified, Suction available and Patient being monitored Patient Re-evaluated:Patient Re-evaluated prior to inductionOxygen Delivery Method: Circle system utilized Preoxygenation: Pre-oxygenation with 100% oxygen Intubation Type: IV induction Ventilation: Mask ventilation without difficulty Laryngoscope Size: Miller and 2 Grade View: Grade I Tube type: Oral Tube size: 7.5 mm Number of attempts: 1 Placement Confirmation: ETT inserted through vocal cords under direct vision,  positive ETCO2 and breath sounds checked- equal and bilateral Secured at: 23 cm Tube secured with: Tape Dental Injury: Teeth and Oropharynx as per pre-operative assessment

## 2014-04-06 NOTE — Op Note (Signed)
04/06/2014  4:14 PM  PATIENT:  Philip Ingram    PRE-OPERATIVE DIAGNOSIS:  Bimalleolar Left Ankle Fracture  POST-OPERATIVE DIAGNOSIS:  Same  PROCEDURE:  Open Reduction Internal Fixation Left Bimalleolar Ankle Fracture  SURGEON:  Nadara Mustard, MD  PHYSICIAN ASSISTANT:None ANESTHESIA:   General  PREOPERATIVE INDICATIONS:  [name] is a  38 y.o. male with a diagnosis of Bimalleolar Left Ankle Fracture who failed conservative measures and elected for surgical management.    The risks benefits and alternatives were discussed with the patient preoperatively including but not limited to the risks of infection, bleeding, nerve injury, cardiopulmonary complications, the need for revision surgery, among others, and the patient was willing to proceed.  OPERATIVE IMPLANTS: Locking Synthes one third tubular plate on the fibula and cannulated screws for the medial malleolus  OPERATIVE FINDINGS: Radiograph shows congruent mortise postoperatively  OPERATIVE PROCEDURE: Patient was brought to the operating room and underwent a general anesthetic. After adequate levels and anesthesia were obtained patient's left lower extremity was prepped using DuraPrep draped into a sterile field. A lateral incision was made this was carried down to the fracture site. The fracture edges were freshened and the fibula was reduced and a lag screw was used to stabilize the fracture. A anti-glide plate was then placed posterior laterally and this was secured proximally with 3 compression screws and distally with one locking screw. C-arm fluoroscopy verified alignment the fibula. Attention was then focused on the medial malleolus. A medial incision was made longitudinally the fracture edges were freshened reduced and K wires were used to stabilize the fracture. Using a 40 cannulated screw these were then stabilized the wires were removed. C-arm fluoroscopy verified alignment of both AP and lateral planes. The wounds were  irrigated with normal saline. The incisions were closed using Vicryl and staples. Sterile compressive dressing was applied. Patient was extubated taken to the PACU in stable condition.

## 2014-04-06 NOTE — Transfer of Care (Signed)
Immediate Anesthesia Transfer of Care Note  Patient: Philip Ingram  Procedure(s) Performed: Procedure(s): Open Reduction Internal Fixation Left Bimalleolar Ankle Fracture (Left)  Patient Location: PACU  Anesthesia Type:General  Level of Consciousness: awake, alert  and oriented  Airway & Oxygen Therapy: Patient Spontanous Breathing and Patient connected to nasal cannula oxygen  Post-op Assessment: Report given to PACU RN and Post -op Vital signs reviewed and stable  Post vital signs: Reviewed and stable  Complications: No apparent anesthesia complications

## 2014-04-06 NOTE — Anesthesia Preprocedure Evaluation (Signed)
Anesthesia Evaluation  Patient identified by MRN, date of birth, ID band Patient awake    Reviewed: Allergy & Precautions, H&P , NPO status , Patient's Chart, lab work & pertinent test results  Airway       Dental   Pulmonary          Cardiovascular     Neuro/Psych    GI/Hepatic   Endo/Other  diabetes, Type 2  Renal/GU      Musculoskeletal   Abdominal   Peds  Hematology   Anesthesia Other Findings   Reproductive/Obstetrics                           Anesthesia Physical Anesthesia Plan  ASA: II  Anesthesia Plan: General   Post-op Pain Management:    Induction: Intravenous  Airway Management Planned: Oral ETT  Additional Equipment:   Intra-op Plan:   Post-operative Plan: Extubation in OR  Informed Consent: I have reviewed the patients History and Physical, chart, labs and discussed the procedure including the risks, benefits and alternatives for the proposed anesthesia with the patient or authorized representative who has indicated his/her understanding and acceptance.     Plan Discussed with: CRNA, Anesthesiologist and Surgeon  Anesthesia Plan Comments:         Anesthesia Quick Evaluation

## 2014-04-06 NOTE — Progress Notes (Signed)
Orthopedic Tech Progress Note Patient Details:  Philip Ingram June 24, 1976 [id number] Patient already has CAM Dan Humphreys that was issued earlier in the week. Patient may continue to use that CAM walker through out recovery.   Patient ID: Philip Ingram, male   DOB: 1976/05/07, 38 y.o.   MRN: [id number]   Philip Ingram 04/06/2014, 7:46 PM

## 2014-04-06 NOTE — Progress Notes (Signed)
Patient with CBG of 205 on arrival to floor.  Patient states he does not check CBG's at home, and is not on any meds.  CBG in PACU was 243 after eating meal before transfer to floor.  Spoke with Dr. Roda Shutters, new orders for CBG's ac/hs with SSI coverage.

## 2014-04-06 NOTE — Discharge Instructions (Signed)
Touchdown weightbearing on the left. Use fracture boot for ambulation. Keep dressing clean dry and intact until followup.

## 2014-04-06 NOTE — H&P (Signed)
Philip Ingram is an 38 y.o. male.   Chief Complaint: Displaced left ankle bimalleolar fracture. HPI: Patient is a 38 year old gentleman who presents with acute displaced left bimalleolar ankle fracture.  Past Medical History  Diagnosis Date  . Diabetes mellitus without complication     No past surgical history on file.  No family history on file. Social History:  reports that he has never smoked. He does not have any smokeless tobacco history on file. He reports that he does not drink alcohol. His drug history is not on file.  Allergies: No Known Allergies  No prescriptions prior to admission    No results found for this or any previous visit (from the past 48 hour(s)). No results found.  Review of Systems  All other systems reviewed and are negative.   There were no vitals taken for this visit. Physical Exam  On examination patient has palpable pulses. The skin is intact. Radiographs shows a displaced Weber B. fibular fracture with non-congruent mortise. Assessment/Plan Assessment: Displaced Weber B. bimalleolar left ankle fracture.  Plan: We'll plan for open reduction internal fixation. Risks and benefits were discussed including infection neurovascular injury pain arthritis need for additional surgery. Patient states he understands and wished to proceed at this time.  DUDA,MARCUS V 04/06/2014, 6:15 AM

## 2014-04-06 NOTE — Progress Notes (Signed)
Inpatient Diabetes Program Recommendations  AACE/ADA: New Consensus Statement on Inpatient Glycemic Control (2013)  Target Ranges:  Prepandial:   less than 140 mg/dL      Peak postprandial:   less than 180 mg/dL (1-2 hours)      Critically ill patients:  140 - 180 mg/dL     Results for Philip Ingram, Philip Ingram (MRN [id number]) as of 04/06/2014 12:30  Ref. Range 04/06/2014 11:05  Glucose-Capillary Latest Range: 70-99 mg/dL 782 (H)    Called by Short stay RN caring for this patient.  Patient admitted for ankle surgery.  CBG 324 mg/dl on admission.  Patient has history of DM.  Per RN, patient lacks significant insight into disease process of DM.  Spoke with patient about his DM history.  Patient told me he was diagnosed in 2009 and was discharged home on insulin (couldn't remember the names of the insulins).  Patient stated he saw a doctor 30 days after that discharge and the doctor stopped his insulin and put him on Metformin.  Patient stopped taking his Metformin in 2010 b/c he stated he "felt better" and didn't think he had DM anymore.  Explained to patient that his DM diagnosis did not go away and that he needs a primary care physician to help better manage his CBGs.  Explained to patient that many times patients may be able to stop their medications and manage their blood sugars with nutrition and exercise, however, they still have a diagnosis of DM and need to see a physician on a regular basis to monitor their CBGs and A1c to make sure the patient is staying within their CBG goals.  Patient stated he understands this, however, he kept telling me that he doesn't eat sweets and stays very active and doesn't understand why we keep telling him he has DM.  Again, attempted to try to explain to patient that DM does not go away, however, patient was not very open to the information I provided.  I encouraged patient to seek care under a primary physician and have his A1c checked, however, patient did not seem  interested in doing this.  I explained to patient that despite having a normal weight and eating healthfully, patients can still have DM due to their genetics, etc.  Encouraged patient to eat three meals a day and to be careful with portion sizes.  Also encouraged patient to purchase a CBG meter at either [employer] or [employer] or to get a Rx for a CBG meter from a physician once he gets established.  Offered Outpatient DM Education follow-up, however, patient was disinterested.  Gave patient several educational brochures on DM and DM nutrition planning.    Will follow Ambrose Finland RN, MSN, CDE Diabetes Coordinator Inpatient Diabetes Program Team Pager: [phone] (8a-10p)

## 2014-04-06 NOTE — Progress Notes (Signed)
Patient ID: Philip Ingram, male   DOB: [birth year], 38 y.o.   MRN: [id number] Status post open reduction internal fixation bimalleolar left ankle fracture. Plan for discharge to home Saturday.

## 2014-04-06 NOTE — Progress Notes (Signed)
Notified Dr. Diamantina Monks of pt's blood sugar 324. Orders received. Also called diabetes coordinator to come see pt. Diabetes coordinator in to talk to pt.

## 2014-04-07 LAB — GLUCOSE, CAPILLARY
GLUCOSE-CAPILLARY: 186 mg/dL — AB (ref 70–99)
Glucose-Capillary: 263 mg/dL — ABNORMAL HIGH (ref 70–99)
Glucose-Capillary: 209 mg/dL — ABNORMAL HIGH (ref 70–99)

## 2014-04-07 MED ORDER — ASPIRIN 325 MG PO TBEC: 325.0000 mg | DELAYED_RELEASE_TABLET | Freq: Every day | ORAL | Status: AC

## 2014-04-07 NOTE — Progress Notes (Signed)
Dressings c/d/i Foot wwp, NVI Plan to d/c home today after PT eval Rx in chart

## 2014-04-07 NOTE — Evaluation (Signed)
Physical Therapy Evaluation Patient Details Name: Philip Ingram MRN: [id number] DOB: 06-27-1976 Today's Date: 04/07/2014   History of Present Illness  open reduction internal fixation bimalleolar left ankle fracture  Clinical Impression  Patient evaluated by Physical Therapy with no further acute PT needs identified. Pt only limited in mobility by pain, will be safe from mobility standpoint to D/C when pain is controlled. All education has been completed and the patient has no further questions. See below for any follow-up Physial Therapy or equipment needs. PT is signing off. Thank you for this referral.     Follow Up Recommendations No PT follow up;Supervision - Intermittent;Other (comment) (OPPT when TWB status is updated)    Equipment Recommendations  None recommended by PT    Recommendations for Other Services       Precautions / Restrictions Precautions Precautions: None Required Braces or Orthoses: Other Brace/Splint Other Brace/Splint: CAM walker- pt refusing due to pain Restrictions Weight Bearing Restrictions: Yes LLE Weight Bearing: Touchdown weight bearing      Mobility  Bed Mobility Overal bed mobility: Modified Independent             General bed mobility comments: incr time required due to pain  Transfers Overall transfer level: Needs assistance Equipment used: Crutches Transfers: Sit to/from Stand Sit to Stand: Supervision         General transfer comment: min cues for proper technique with crutches; no sway or LOB noted  Ambulation/Gait Ambulation/Gait assistance: Supervision Ambulation Distance (Feet): 20 Feet Assistive device: Crutches Gait Pattern/deviations: Step-to pattern Gait velocity: decr due to pain and TWB status  Gait velocity interpretation: Below normal speed for age/gender General Gait Details: pt limited due to pain; supervision for safety and min cues for technique; no LOB noted and good safety awareness   Stairs             Wheelchair Mobility    Modified Rankin (Stroke Patients Only)       Balance Overall balance assessment: No apparent balance deficits (not formally assessed);Modified Independent                                           Pertinent Vitals/Pain Pain Assessment: 0-10 Pain Score: 8  Pain Location: Lt ankle  Pain Descriptors / Indicators: Burning Pain Intervention(s): Limited activity within patient's tolerance;Monitored during session;Repositioned    Home Living Family/patient expects to be discharged to:: Private residence Living Arrangements: Spouse/significant other;Children Available Help at Discharge: Family;Available 24 hours/day Type of Home: House Home Access: Level entry     Home Layout: One level Home Equipment: Crutches      Prior Function Level of Independence: Independent         Comments: has been ambulating with crutches since ankle fx      Hand Dominance        Extremity/Trunk Assessment   Upper Extremity Assessment: Overall WFL for tasks assessed           Lower Extremity Assessment: LLE deficits/detail   LLE Deficits / Details: Lt ankle limited; pt quad and hip 4/5   Cervical / Trunk Assessment: Normal  Communication   Communication: No difficulties  Cognition Arousal/Alertness: Awake/alert Behavior During Therapy: WFL for tasks assessed/performed Overall Cognitive Status: Within Functional Limits for tasks assessed  General Comments General comments (skin integrity, edema, etc.): discussed D/C plan; pt feels ready pending pain maangement    Exercises        Assessment/Plan    PT Assessment Patent does not need any further PT services  PT Diagnosis Difficulty walking;Acute pain   PT Problem List    PT Treatment Interventions     PT Goals (Current goals can be found in the Care Plan section) Acute Rehab PT Goals Patient Stated Goal: to not have pain PT Goal  Formulation: No goals set, d/c therapy    Frequency     Barriers to discharge        Co-evaluation               End of Session Equipment Utilized During Treatment: Gait belt Activity Tolerance: Patient limited by pain Patient left: in chair;with call bell/phone within reach Nurse Communication: Mobility status;Patient requests pain meds    Functional Assessment Tool Used: clinical judgement  Functional Limitation: Mobility: Walking and moving around Mobility: Walking and Moving Around Current Status [phone]): At least 1 percent but less than 20 percent impaired, limited or restricted Mobility: Walking and Moving Around Goal Status [phone]): At least 1 percent but less than 20 percent impaired, limited or restricted Mobility: Walking and Moving Around Discharge Status [phone]): At least 1 percent but less than 20 percent impaired, limited or restricted    Time: 0819-0848 PT Time Calculation (min): 29 min   Charges:   PT Evaluation $Initial PT Evaluation Tier I: 1 Procedure PT Treatments $Gait Training: 8-22 mins   PT G Codes:   Functional Assessment Tool Used: clinical judgement  Functional Limitation: Mobility: Walking and moving around    Sterling, [place], [place]  [phone] 04/07/2014, 9:37 AM

## 2014-04-09 ENCOUNTER — Encounter (HOSPITAL_COMMUNITY): Payer: Self-pay | Admitting: Emergency Medicine

## 2014-04-09 ENCOUNTER — Telehealth (HOSPITAL_COMMUNITY): Payer: Self-pay | Admitting: *Deleted

## 2014-04-09 ENCOUNTER — Emergency Department (INDEPENDENT_AMBULATORY_CARE_PROVIDER_SITE_OTHER)
Admission: EM | Admit: 2014-04-09 | Discharge: 2014-04-09 | Disposition: A | Discharge status: Home / Self Care | Payer: Medicaid Other | Source: Home / Self Care | Attending: Family Medicine | Admitting: Family Medicine

## 2014-04-09 DIAGNOSIS — E1065 Type 1 diabetes mellitus with hyperglycemia: Secondary | ICD-10-CM

## 2014-04-09 DIAGNOSIS — E109 Type 1 diabetes mellitus without complications: Secondary | ICD-10-CM

## 2014-04-09 LAB — POCT I-STAT, CHEM 8
BUN: 11 mg/dL (ref 6–23)
Calcium, Ion: 1.17 mmol/L (ref 1.12–1.23)
Chloride: 101 mEq/L (ref 96–112)
Creatinine, Ser: 0.7 mg/dL (ref 0.50–1.35)
Glucose, Bld: 208 mg/dL — ABNORMAL HIGH (ref 70–99)
HEMATOCRIT: 36 % — AB (ref 39.0–52.0)
Hemoglobin: 12.2 g/dL — ABNORMAL LOW (ref 13.0–17.0)
POTASSIUM: 3.8 meq/L (ref 3.7–5.3)
SODIUM: 135 meq/L — AB (ref 137–147)
TCO2: 27 mmol/L (ref 0–100)

## 2014-04-09 MED ORDER — GLIPIZIDE 5 MG PO TABS: 5.0000 mg | ORAL_TABLET | Freq: Two times a day (BID) | ORAL | Status: AC

## 2014-04-09 MED ORDER — BLOOD GLUCOSE MONITORING SUPPL DEVI: Status: AC

## 2014-04-09 MED ORDER — BLOOD GLUCOSE MONITORING SUPPL KIT: PACK | Status: AC

## 2014-04-09 NOTE — Discharge Summary (Signed)
  Final diagnosis bimalleolar left ankle fracture. Discharge to home in stable condition. Followup in the office in one week.

## 2014-04-09 NOTE — Care Management Note (Signed)
CARE MANAGEMENT NOTE 04/09/2014  Patient:  KAZIMIERZ, SPRINGBORN   Account Number:  [id number]  Date Initiated:  04/09/2014  Documentation initiated by:  Vance Peper  Subjective/Objective Assessment:   38 yr old male s/p ORIF of left sankle fracture.     Action/Plan:   No home health or DME needs identified by therapy.   Anticipated DC Date:  04/09/2014   Anticipated DC Plan:  HOME/SELF CARE      DC Planning Services  CM consult      PAC Choice  NA   Choice offered to / List presented to:     DME arranged  NA        HH arranged  NA      Status of service:  Completed, signed off Medicare Important Message given?   (If response is "NO", the following Medicare IM given date fields will be blank) Date Medicare IM given:   Medicare IM given by:   Date Additional Medicare IM given:   Additional Medicare IM given by:    Discharge Disposition:  HOME/SELF CARE  Per UR Regulation:  Reviewed for med. necessity/level of care/duration of stay

## 2014-04-09 NOTE — ED Notes (Signed)
Pharmacist from [facility] [address] called and said that he needed the order for lancets and test strips. I told him I would call back.  Discussed with Dr. Juventino Slovak and he said he did a separate order for for the glucose supply kit. He said he thought that it included the lancets and test strips- he said # 30 for each if he needs a separate order.  I called the pharmacist back and he said he does need a separate order for each. I told him # 30 of each one. He said they come in #50 or # 100. I told him # 88 is the closest to 34.   Roselyn Meier 04/09/2014

## 2014-04-09 NOTE — ED Provider Notes (Signed)
CSN: [id number]     Arrival date & time 04/09/14  1451 History   First MD Initiated Contact with Patient 04/09/14 1514     No chief complaint on file.  (Consider location/radiation/quality/duration/timing/severity/associated sxs/prior Treatment) Patient is a 38 y.o. male presenting with hyperglycemia. The history is provided by the patient and the spouse.  Hyperglycemia Blood sugar level PTA:  Over 300 during ankle surgery Severity:  Mild Onset quality:  Gradual Chronicity:  Chronic Diabetes status:  Controlled with diet Current diabetic therapy:  None Context: not new diabetes diagnosis   Context comment:  Had rxn to metformin in past and stopped med. Associated symptoms: no blurred vision, no increased thirst and no polyuria     Past Medical History  Diagnosis Date  . Type II diabetes mellitus dx'd 2009    "diet controlled"   Past Surgical History  Procedure Laterality Date  . Orif ankle fracture bimalleolar Left 04/06/2014  . Fracture surgery     History reviewed. No pertinent family history. History  Substance Use Topics  . Smoking status: Never Smoker   . Smokeless tobacco: Never Used  . Alcohol Use: No    Review of Systems  Constitutional: Negative.   Eyes: Negative for blurred vision.  Endocrine: Negative for polydipsia, polyphagia and polyuria.    Allergies  Review of patient's allergies indicates no known allergies.  Home Medications   Prior to Admission medications   Medication Sig Start Date End Date Taking? Authorizing Provider  aspirin EC 325 MG EC tablet Take 1 tablet (325 mg total) by mouth daily. 04/07/14   Naiping Glee Arvin, MD  oxyCODONE-acetaminophen (PERCOCET) 5-325 MG per tablet Take 1-2 tablets by mouth every 6 (six) hours as needed. 03/31/14   Monte Fantasia, PA-C  oxyCODONE-acetaminophen (ROXICET) 5-325 MG per tablet Take 1 tablet by mouth every 4 (four) hours as needed for severe pain. 04/06/14   Nadara Mustard, MD   BP 130/86  Pulse 104   Temp(Src) 98.6 F (37 C) (Oral)  Resp 18  SpO2 98% Physical Exam  Nursing note and vitals reviewed. Constitutional: He is oriented to person, place, and time. He appears well-developed and well-nourished.  Neck: Normal range of motion. Neck supple.  Lymphadenopathy:    He has no cervical adenopathy.  Neurological: He is alert and oriented to person, place, and time.  Skin: Skin is warm and dry.    ED Course  Procedures (including critical care time) Labs Review Labs Reviewed - No data to display  Imaging Review No results found.   MDM  No diagnosis found.    Linna Hoff, MD 04/09/14 [phone]

## 2014-04-09 NOTE — Discharge Instructions (Signed)
Use medicine as prescribed , see your doctor for review of daily sugar log and your a1c blood result.

## 2014-04-09 NOTE — ED Notes (Signed)
Was in ED for fractured ankle, and was found to have an elevated glucose reading. Had been on metformin previously, but he broke out in a rash from waist down, and not taken since then (several years ago) needs a meter an medications

## 2014-04-10 ENCOUNTER — Encounter (HOSPITAL_COMMUNITY): Payer: Self-pay | Admitting: Orthopedic Surgery

## 2014-04-10 LAB — HEMOGLOBIN A1C
Hgb A1c MFr Bld: 11 % — ABNORMAL HIGH (ref <5.7)
MEAN PLASMA GLUCOSE: 269 mg/dL — AB (ref <117)

## 2014-04-10 NOTE — ED Notes (Signed)
Hgb A1C 11.0 H and mean glucose 269 H.  Message sent to Dr. Artis FlockKindl. [name], [name] 04/10/2014

## 2014-04-11 NOTE — ED Notes (Signed)
9/29 Hgb A1C 11.0 H and mean glucose 269 H.  Discussed with Dr. Artis FlockKindl and he said pt is supposed to be finding a  PCP, but call him the result. 9/30  I called pt. Pt. verified x 2 and given results.  Pt. said he got his supplies and is taking his medicine, and his sugar was down to 176.  He has not tried to get a PCP yet. He did try to call [facility].  He said he does have insurance. I told him to call the [facility] line and they could give him the names of doctors that are taking new patients, or call his insurance company and see who is in his network.  He should get one ASAP.  Pt. voiced understanding. Philip Ingram, Philip Ingram 04/11/2014

## 2014-04-19 ENCOUNTER — Telehealth: Payer: Self-pay | Admitting: General Practice

## 2014-04-19 NOTE — Telephone Encounter (Signed)
Wife said she was supposed to have received a new pt packet and she hasnt received it yet. She is quite upset that she hasnt received it yet. The address is [address] [place] [id number]

## 2014-04-23 NOTE — Anesthesia Postprocedure Evaluation (Signed)
  Anesthesia Post-op Note  Patient: Philip Ingram  Procedure(s) Performed: Procedure(s): Open Reduction Internal Fixation Left Bimalleolar Ankle Fracture (Left)  Patient Location: PACU  Anesthesia Type:General  Level of Consciousness: awake, alert , oriented and patient cooperative  Airway and Oxygen Therapy: Patient Spontanous Breathing  Post-op Pain: mild  Post-op Assessment: Post-op Vital signs reviewed, Patient's Cardiovascular Status Stable, Respiratory Function Stable, Patent Airway, No signs of Nausea or vomiting and Pain level controlled  Post-op Vital Signs: stable  Last Vitals:  Filed Vitals:   04/07/14 0528  BP: 107/63  Pulse: 98  Temp: 36.6 C  Resp:     Complications: No apparent anesthesia complications

## 2014-05-02 NOTE — Telephone Encounter (Signed)
Will check with NP Coordinator to see if there is evidence that packet has been sent.

## 2016-02-17 IMAGING — CR DG CHEST 2V
2 series · 2 of 2 positions shown · non-contrast
Comparison: 07/10/2008.

CLINICAL DATA: Preop left ankle fracture.

EXAM:
CHEST  2 VIEW

[w chest pa]
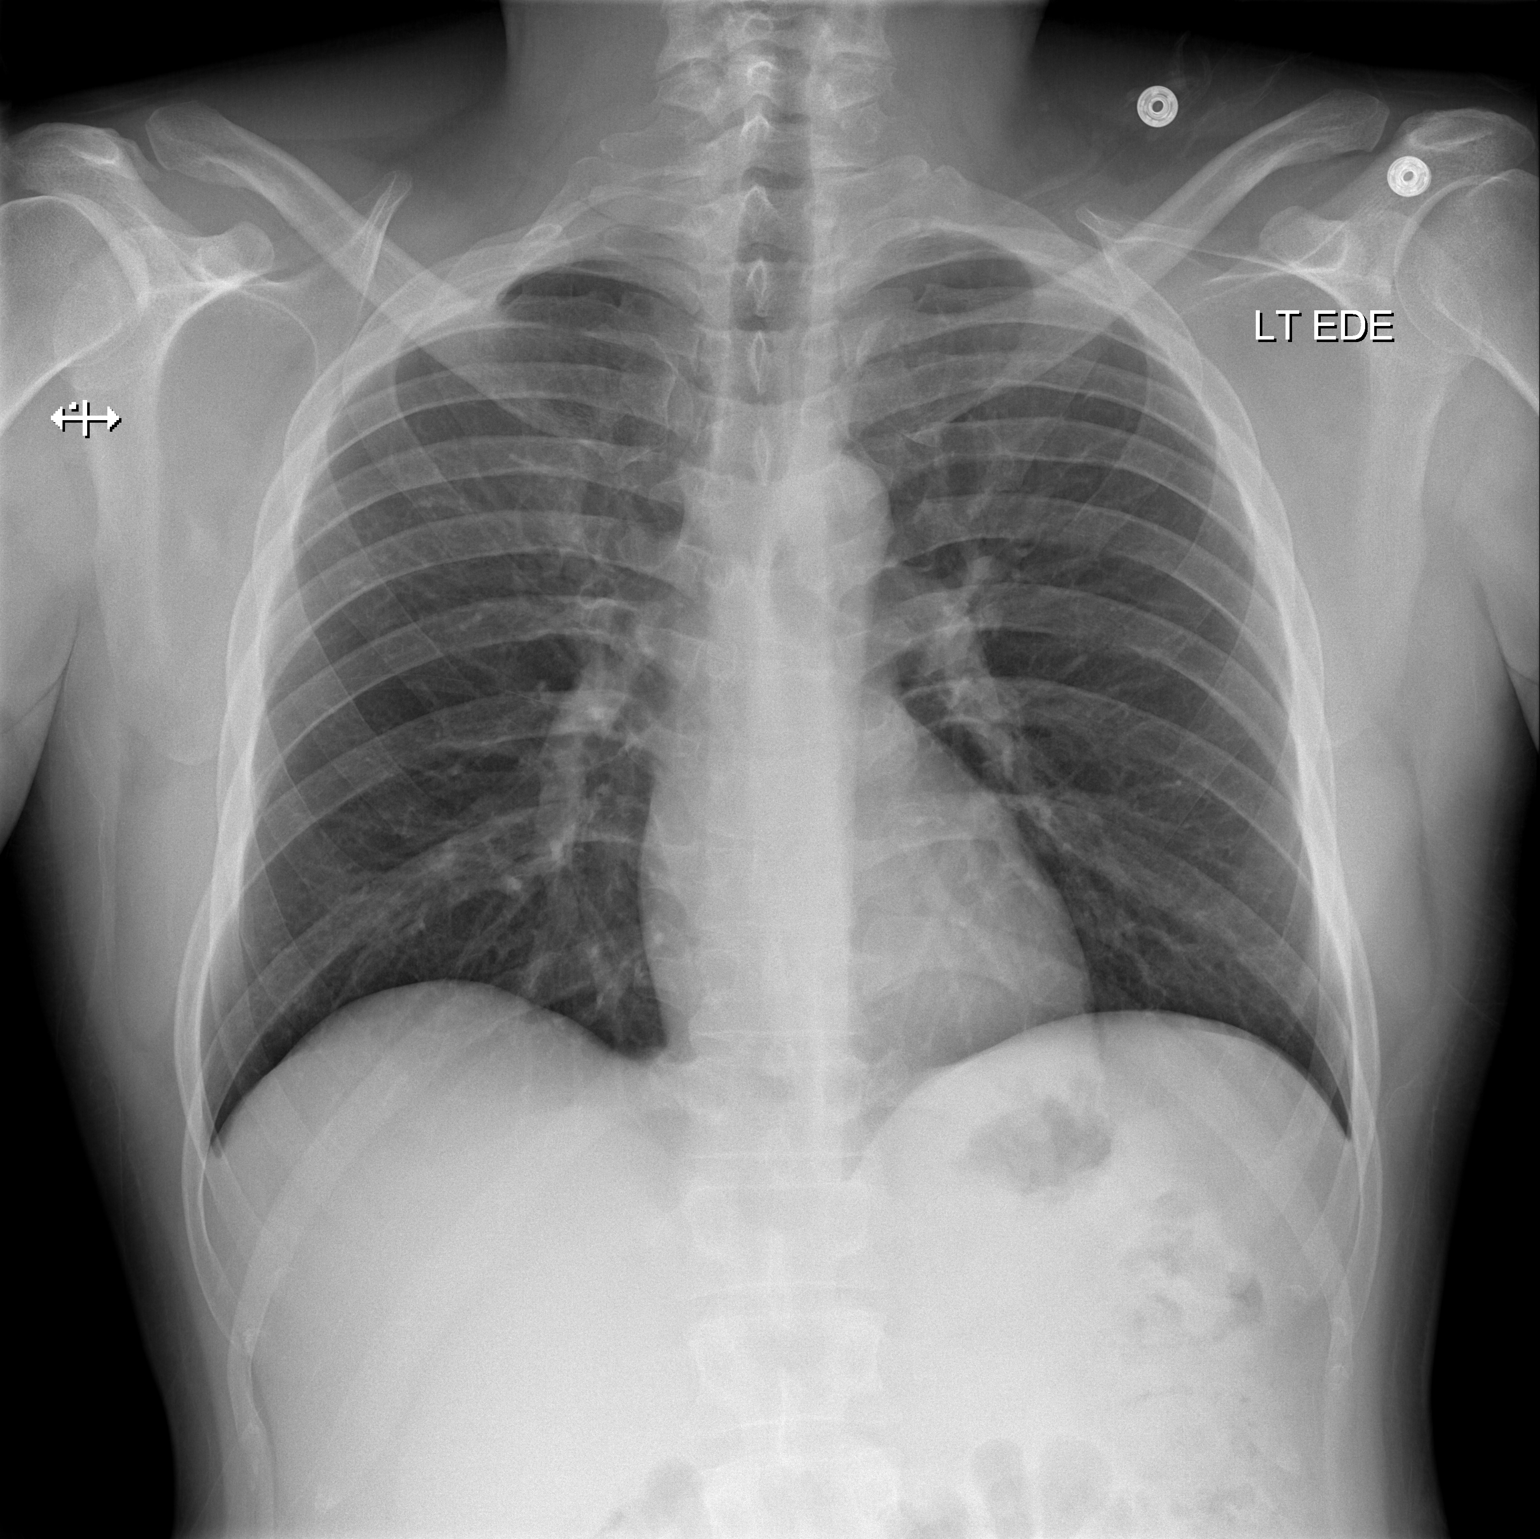

[w chest lat]
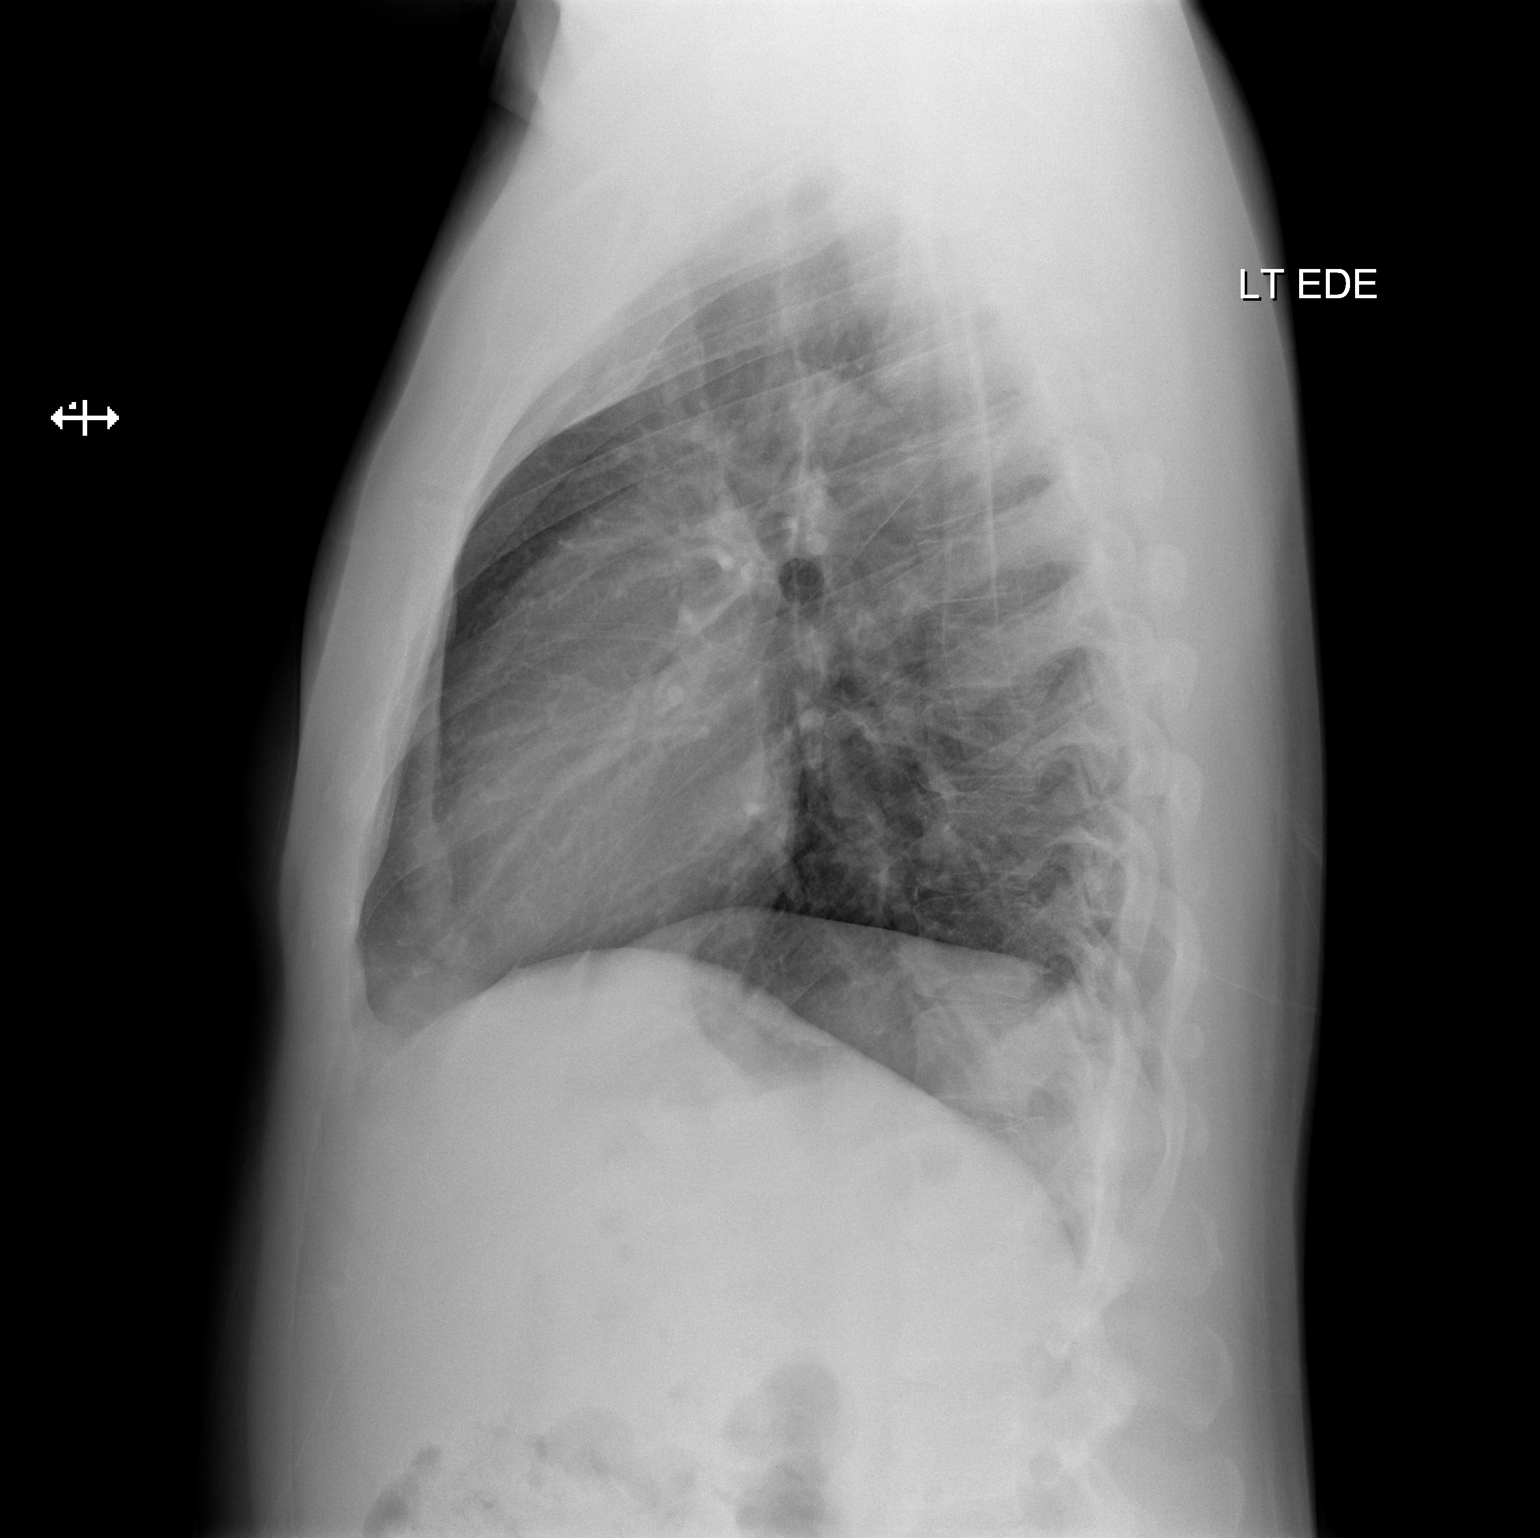

[2 of 2 positions shown; findings below may reference images not displayed]

FINDINGS: The heart size and mediastinal contours are within normal limits.
Both lungs are clear. The visualized skeletal structures are
unremarkable.
IMPRESSION: No active cardiopulmonary disease.
